# Patient Record
Sex: Female | Born: 1980 | Race: White | Hispanic: No | Smoking: Current every day smoker
Health system: Southern US, Community
[De-identification: ages and names within clinical notes are randomized; demographics above are authoritative.]

---

## 2008-08-24 ENCOUNTER — Ambulatory Visit: Payer: Self-pay | Admitting: Obstetrics and Gynecology

## 2008-08-25 ENCOUNTER — Inpatient Hospital Stay: Payer: Self-pay | Admitting: Obstetrics and Gynecology

## 2008-09-19 ENCOUNTER — Ambulatory Visit: Payer: Self-pay | Admitting: Pediatrics

## 2010-07-26 ENCOUNTER — Ambulatory Visit: Payer: Self-pay | Admitting: Internal Medicine

## 2011-05-30 ENCOUNTER — Ambulatory Visit: Payer: Self-pay

## 2012-08-14 IMAGING — CT CT HEAD WITHOUT CONTRAST
1 of 2 series · 16 of 30 positions shown, 20 images · non-contrast
Comparison: none

REASON FOR EXAM: [id number] altered mental status headaches
COMMENTS:

PROCEDURE:     CT  - CT HEAD WITHOUT CONTRAST  - July 26, 2010 [DATE]
RESULT:     Technique: Helical 5mm sections were obtained from the skull
base to the vertex without administration of intravenous contrast.

[Series 2: soft tissue · axial · 0.39mm/px · z∈[-160,-36]mm · 16 of 29 slices shown, 20 images]
[im 2/29  brain]
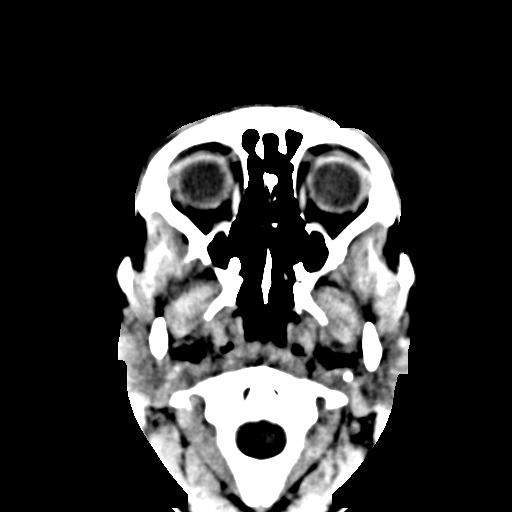
[im 2/29  bone]
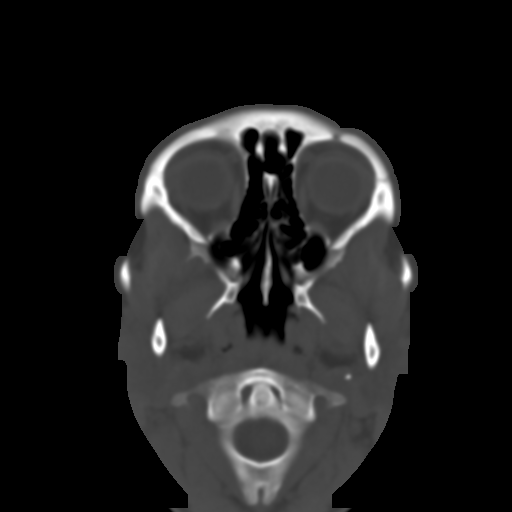
[im 4/29  brain]
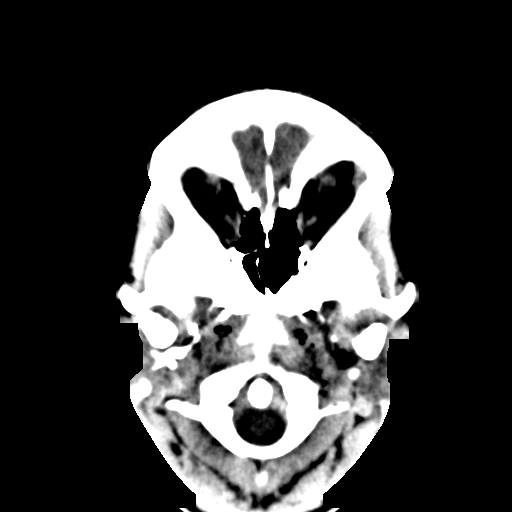
[im 5/29  brain]
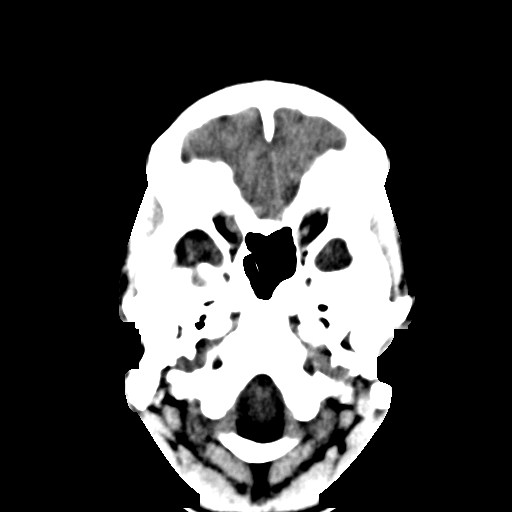
[im 7/29  brain]
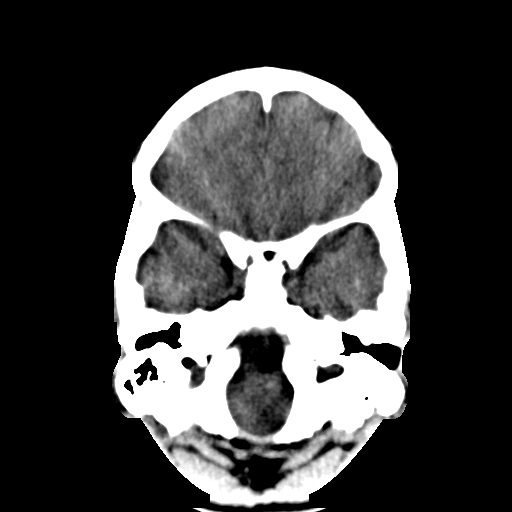
[im 9/29  brain]
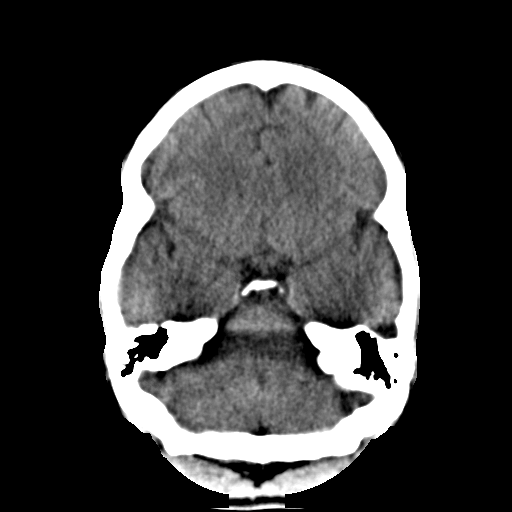
[im 9/29  bone]
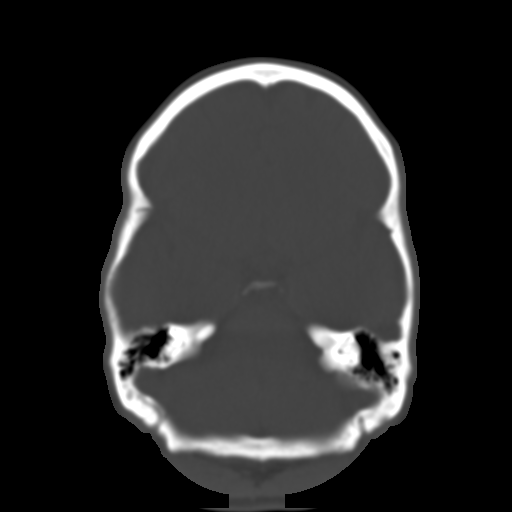
[im 10/29  brain]
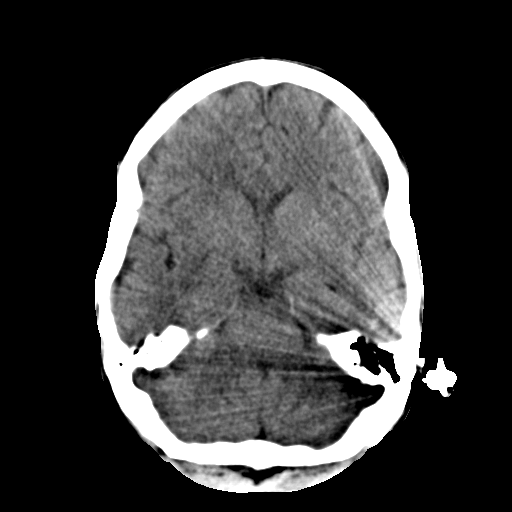
[im 11/29  brain]
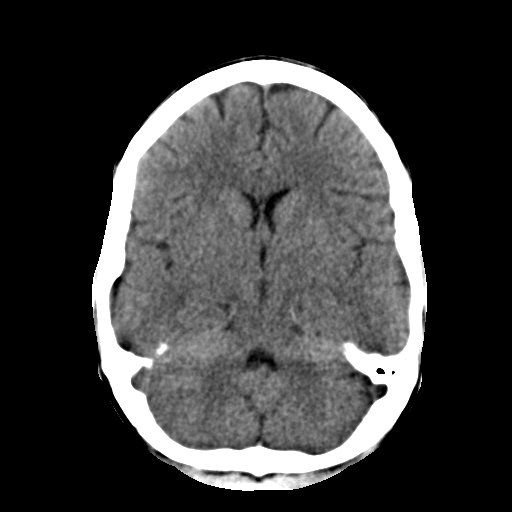
[im 14/29  brain]
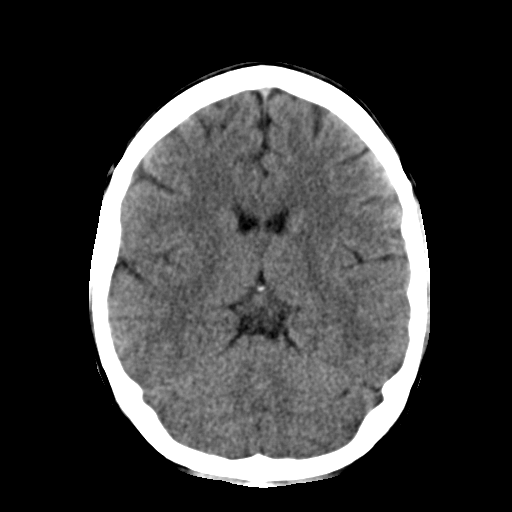
[im 15/29  brain]
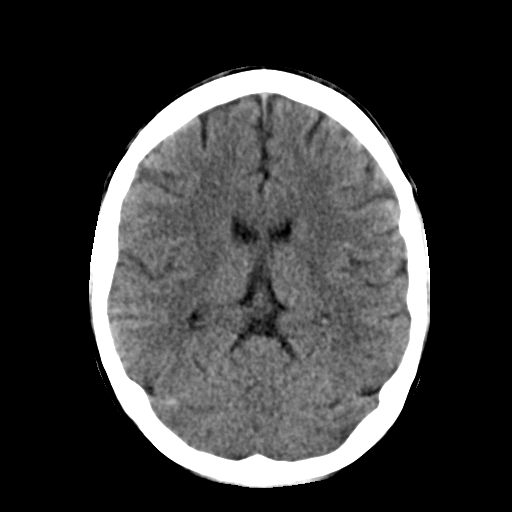
[im 15/29  bone]
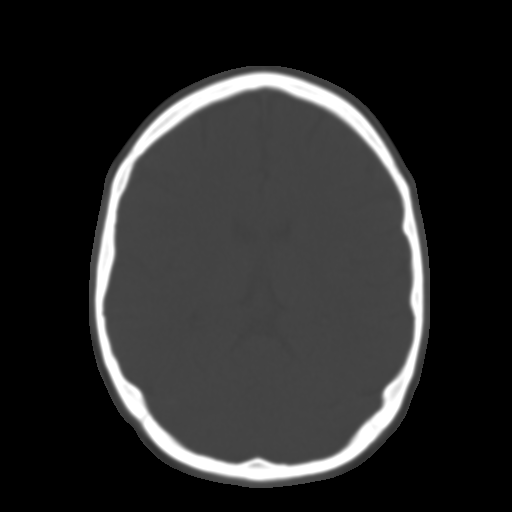
[im 18/29  brain]
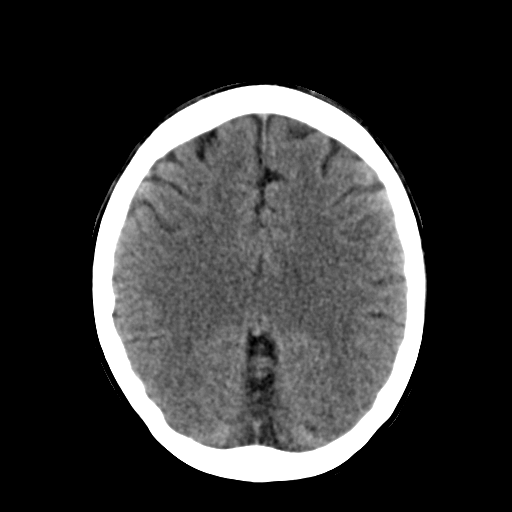
[im 19/29  brain]
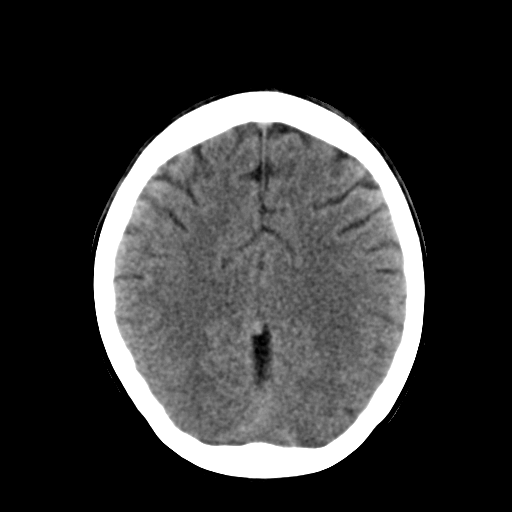
[im 20/29  brain]
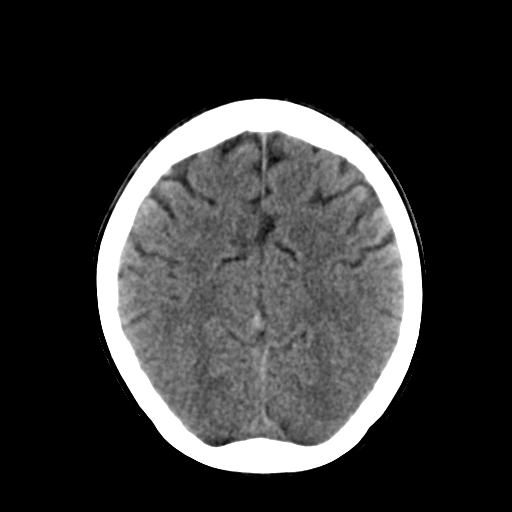
[im 22/29  brain]
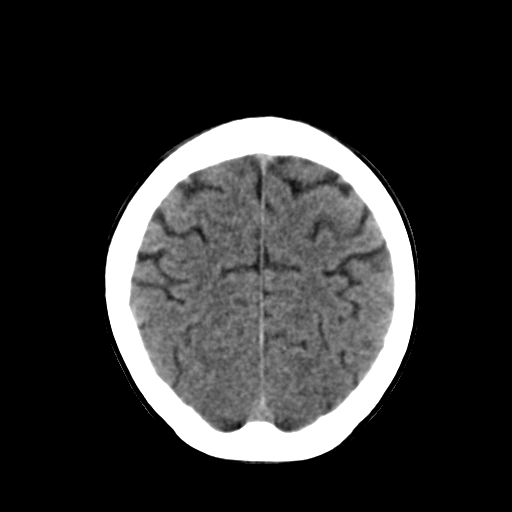
[im 22/29  bone]
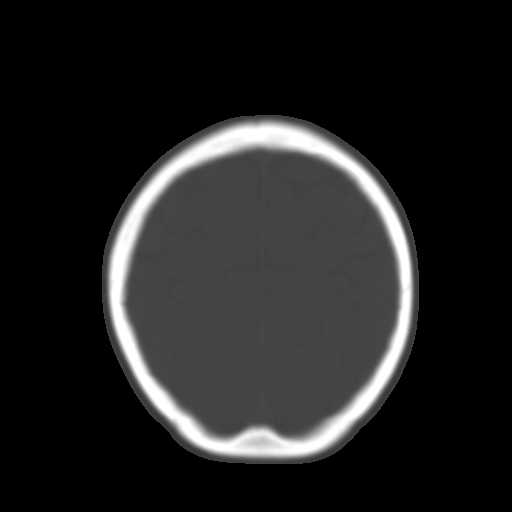
[im 24/29  brain]
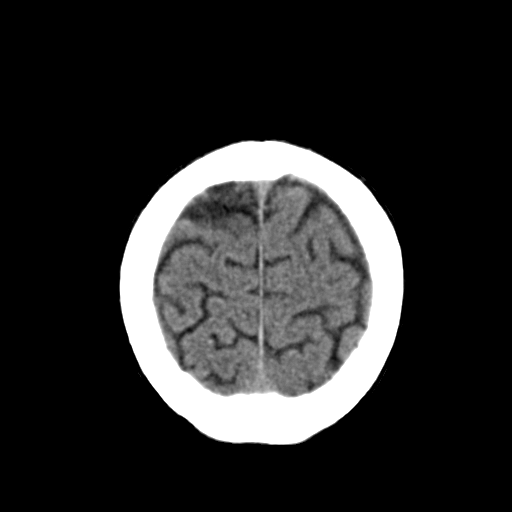
[im 25/29  brain]
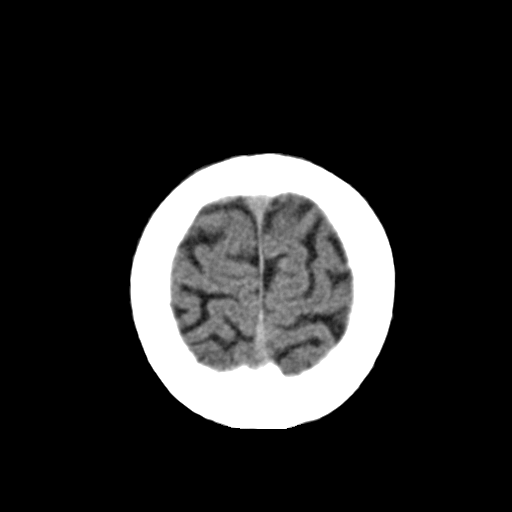
[im 27/29  brain]
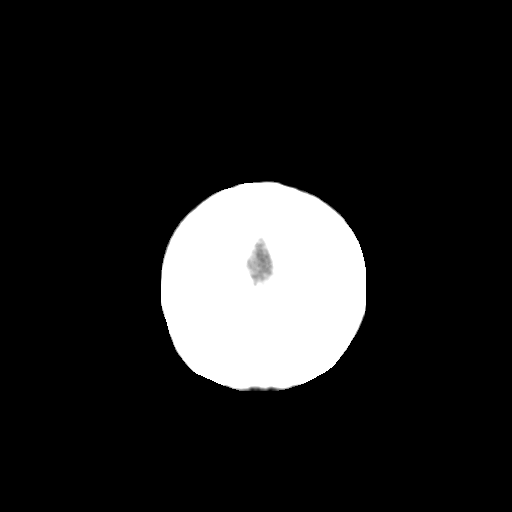

[16 of 30 positions shown; findings below may reference images not displayed]

FINDINGS: There is not evidence of intra-axial fluid collections. There is
no evidence of acute hemorrhage or secondary signs reflecting mass effect or
subacute or chronic focal territorial infarction. The osseous structures
demonstrate no evidence of a depressed skull fracture. If there is
persistent concern clinical follow-up with MRI is recommended.
IMPRESSION: 1. No evidence of acute intracranial abnormalitites.

## 2014-05-02 ENCOUNTER — Encounter: Payer: Self-pay | Admitting: *Deleted

## 2014-05-04 ENCOUNTER — Encounter: Payer: Self-pay | Admitting: *Deleted

## 2020-01-30 ENCOUNTER — Ambulatory Visit
Admission: RE | Admit: 2020-01-30 | Discharge: 2020-01-30 | Disposition: A | Discharge status: Home / Self Care | Payer: 59 | Source: Ambulatory Visit

## 2020-01-30 ENCOUNTER — Other Ambulatory Visit: Payer: Self-pay

## 2020-01-30 VITALS — BP 123/77 | HR 106 | Temp 98.2°F | Resp 14 | Ht 60.0 in | Wt 130.0 lb

## 2020-01-30 DIAGNOSIS — H66001 Acute suppurative otitis media without spontaneous rupture of ear drum, right ear: Secondary | ICD-10-CM

## 2020-01-30 DIAGNOSIS — H9201 Otalgia, right ear: Secondary | ICD-10-CM | POA: Diagnosis not present

## 2020-01-30 MED ORDER — CEFDINIR 300 MG PO CAPS: 300.0000 mg | ORAL_CAPSULE | Freq: Two times a day (BID) | ORAL | 0 refills | Status: AC

## 2020-01-30 NOTE — ED Provider Notes (Signed)
[facility]    CSN: [id number] Arrival date & time: 01/30/20  1109      History   Chief Complaint Chief Complaint  Patient presents with  . Otalgia    APPOINTMENT    HPI Tracey Grant is a 40 y.o. female presenting for right sided ear pain and pressure x 6 days. Denies fever. Admits to fatigue. Denies sore throat or cough. Admits to mild congestion. No ear drainage. Admits muffled/decreased hearing. Has been taking Motrin and Tylenol. Denies COVID exposure, but reports she is a Engineer, civil (consulting).  Patient has no other complaints or concerns.  HPI  History reviewed. No pertinent past medical history.  There are no problems to display for this patient.   History reviewed. No pertinent surgical history.  OB History   No obstetric history on file.      Home Medications    Prior to Admission medications   Medication Sig Start Date End Date Taking? Authorizing Provider  ALPRAZolam Prudy Feeler) 0.5 MG tablet alprazolam 0.5 mg tablet 09/06/16  Yes [provider]  buPROPion (WELLBUTRIN XL) 150 MG 24 hr tablet bupropion HCl XL 150 mg 24 hr tablet, extended release 09/05/16  Yes [provider]  cefdinir (OMNICEF) 300 MG capsule Take 1 capsule (300 mg total) by mouth 2 (two) times daily for 10 days. 01/30/20 02/09/20 Yes Shirlee Latch, PA-C    Family History History reviewed. No pertinent family history.  Social History Social History   Tobacco Use  . Smoking status: Current Every Day Smoker  . Smokeless tobacco: Never Used  Vaping Use  . Vaping Use: Some days  Substance Use Topics  . Alcohol use: Yes  . Drug use: Never     Allergies   Patient has no known allergies.   Review of Systems Review of Systems  Constitutional: Negative for chills, diaphoresis, fatigue and fever.  HENT: Positive for congestion, ear pain and rhinorrhea. Negative for sinus pressure, sinus pain and sore throat.   Respiratory: Negative for cough and shortness of breath.    Gastrointestinal: Negative for abdominal pain, nausea and vomiting.  Musculoskeletal: Negative for arthralgias and myalgias.  Skin: Negative for rash.  Neurological: Negative for weakness and headaches.  Hematological: Negative for adenopathy.     Physical Exam Triage Vital Signs ED Triage Vitals  Enc Vitals Group     BP 01/30/20 1200 123/77     Pulse Rate 01/30/20 1200 (!) 106     Resp 01/30/20 1200 14     Temp 01/30/20 1200 98.2 F (36.8 C)     Temp Source 01/30/20 1200 Oral     SpO2 01/30/20 1200 100 %     Weight 01/30/20 1157 130 lb (59 kg)     Height 01/30/20 1157 5' (1.524 m)     Head Circumference --      Peak Flow --      Pain Score 01/30/20 1157 6     Pain Loc --      Pain Edu? --      Excl. in GC? --    No data found.  Updated Vital Signs BP 123/77 (BP Location: Left Arm)   Pulse (!) 106   Temp 98.2 F (36.8 C) (Oral)   Resp 14   Ht 5' (1.524 m)   Wt 130 lb (59 kg)   LMP 01/23/2020 (Approximate)   SpO2 100%   BMI 25.39 kg/m       Physical Exam Vitals and nursing note reviewed.  Constitutional:      General: She is not in acute distress.    Appearance: Normal appearance. She is not ill-appearing or toxic-appearing.  HENT:     Head: Normocephalic and atraumatic.     Right Ear: Hearing, ear canal and external ear normal. A middle ear effusion is present. Tympanic membrane is injected and bulging.     Left Ear: Hearing, tympanic membrane, ear canal and external ear normal.     Nose: Nose normal.     Mouth/Throat:     Mouth: Mucous membranes are moist.     Pharynx: Oropharynx is clear.  Eyes:     General: No scleral icterus.       Right eye: No discharge.        Left eye: No discharge.     Conjunctiva/sclera: Conjunctivae normal.  Cardiovascular:     Rate and Rhythm: Normal rate and regular rhythm.     Heart sounds: Normal heart sounds.  Pulmonary:     Effort: Pulmonary effort is normal. No respiratory distress.     Breath sounds: Normal  breath sounds.  Musculoskeletal:     Cervical back: Neck supple.  Skin:    General: Skin is dry.  Neurological:     General: No focal deficit present.     Mental Status: She is alert. Mental status is at baseline.     Motor: No weakness.     Gait: Gait normal.  Psychiatric:        Mood and Affect: Mood normal.        Behavior: Behavior normal.        Thought Content: Thought content normal.      UC Treatments / Results  Labs (all labs ordered are listed, but only abnormal results are displayed) Labs Reviewed - No data to display  EKG   Radiology No results found.  Procedures Procedures (including critical care time)  Medications Ordered in UC Medications - No data to display  Initial Impression / Assessment and Plan / UC Course  I have reviewed the triage vital signs and the nursing notes.  Pertinent labs & imaging results that were available during my care of the patient were reviewed by me and considered in my medical decision making (see chart for details).   Exam is consistent with a mild acute otitis media.  Advised watch and wait.  Of 2 days and taking Mucinex D and using Flonase.  Advised if not getting better or symptoms worsen after 3 days to start the cefdinir and complete full course.  Advised increase rest and fluids.  Advised to follow-up with our clinic as needed for new or worsening symptoms.   Final Clinical Impressions(s) / UC Diagnoses   Final diagnoses:  Acute suppurative otitis media of right ear without spontaneous rupture of tympanic membrane, recurrence not specified  Right ear pain     Discharge Instructions     Watch and wait x2 days.  Take Mucinex D and use Flonase.  If not better in 2 days, start antibiotics and complete full course.    ED Prescriptions    Medication Sig Dispense Auth. Provider   cefdinir (OMNICEF) 300 MG capsule Take 1 capsule (300 mg total) by mouth 2 (two) times daily for 10 days. 20 capsule Shirlee Latch,  PA-C     PDMP not reviewed this encounter.   Shirlee Latch, PA-C 01/30/20 1240

## 2020-01-30 NOTE — Discharge Instructions (Addendum)
Watch and wait x2 days.  Take Mucinex D and use Flonase.  If not better in 2 days, start antibiotics and complete full course.

## 2020-01-30 NOTE — ED Triage Notes (Signed)
Patient c/o pain in her right ear that started Monday night.

## 2021-08-28 ENCOUNTER — Ambulatory Visit
Admission: EM | Admit: 2021-08-28 | Discharge: 2021-08-28 | Disposition: A | Discharge status: Home / Self Care | Payer: 59

## 2021-08-28 DIAGNOSIS — J069 Acute upper respiratory infection, unspecified: Secondary | ICD-10-CM | POA: Diagnosis not present

## 2021-08-28 MED ORDER — IPRATROPIUM BROMIDE 0.06 % NA SOLN: 2.0000 | Freq: Four times a day (QID) | NASAL | 12 refills | Status: DC

## 2021-08-28 MED ORDER — PROMETHAZINE-DM 6.25-15 MG/5ML PO SYRP: 5.0000 mL | ORAL_SOLUTION | Freq: Four times a day (QID) | ORAL | 0 refills | Status: DC | PRN

## 2021-08-28 MED ORDER — BENZONATATE 100 MG PO CAPS: 200.0000 mg | ORAL_CAPSULE | Freq: Three times a day (TID) | ORAL | 0 refills | Status: DC

## 2021-08-28 NOTE — ED Triage Notes (Signed)
Patient reports that Friday she started with scratchy throat, cough, and nasal congestion.   Patient has taken 2 at home COVID test both were negative.

## 2021-08-28 NOTE — ED Provider Notes (Signed)
[facility]    CSN: [id number] Arrival date & time: 08/28/21  1740      History   Chief Complaint Chief Complaint  Patient presents with   Cough   Nasal Congestion    HPI Tracey Grant is a 41 y.o. female.   HPI  41 year old female here for evaluation of respiratory complaints.  Patient reports that her symptoms began 4 days ago and they consist of nasal congestion with a scratchy sore throat, nasal discharge in the mornings that gets better as the day goes on.  Right ear pain, and a nonproductive cough.  She denies any fever, shortness of breath, wheezing, vomiting, or diarrhea.  History reviewed. No pertinent past medical history.  There are no problems to display for this patient.   History reviewed. No pertinent surgical history.  OB History   No obstetric history on file.      Home Medications    Prior to Admission medications   Medication Sig Start Date End Date Taking? Authorizing Provider  ALPRAZolam Prudy Feeler) 0.5 MG tablet alprazolam 0.5 mg tablet 09/06/16  Yes [provider]  amphetamine-dextroamphetamine (ADDERALL) 10 MG tablet Take 1 tablet in every  afternoon for 30 days 02/08/21  Yes [provider]  amphetamine-dextroamphetamine (ADDERALL) 20 MG tablet Take 20 mg by mouth 3 (three) times daily. 08/23/21  Yes [provider]  benzonatate (TESSALON) 100 MG capsule Take 2 capsules (200 mg total) by mouth every 8 (eight) hours. 08/28/21  Yes Becky Augusta, NP  buPROPion (WELLBUTRIN XL) 150 MG 24 hr tablet bupropion HCl XL 150 mg 24 hr tablet, extended release 09/05/16  Yes [provider]  ipratropium (ATROVENT) 0.06 % nasal spray Place 2 sprays into both nostrils 4 (four) times daily. 08/28/21  Yes Becky Augusta, NP  promethazine-dextromethorphan (PROMETHAZINE-DM) 6.25-15 MG/5ML syrup Take 5 mLs by mouth 4 (four) times daily as needed. 08/28/21  Yes Becky Augusta, NP    Family History History reviewed. No pertinent  family history.  Social History Social History   Tobacco Use   Smoking status: Every Day   Smokeless tobacco: Never  Vaping Use   Vaping Use: Some days  Substance Use Topics   Alcohol use: Yes   Drug use: Never     Allergies   Patient has no known allergies.   Review of Systems Review of Systems  Constitutional:  Negative for fever.  HENT:  Positive for congestion, ear pain, postnasal drip, rhinorrhea and sore throat.   Respiratory:  Positive for cough. Negative for shortness of breath and wheezing.   Gastrointestinal:  Negative for diarrhea and vomiting.  Hematological: Negative.   Psychiatric/Behavioral: Negative.       Physical Exam Triage Vital Signs ED Triage Vitals  Enc Vitals Group     BP 08/28/21 1748 120/83     Pulse Rate 08/28/21 1748 90     Resp --      Temp 08/28/21 1748 98.4 F (36.9 C)     Temp src --      SpO2 08/28/21 1748 100 %     Weight 08/28/21 1746 134 lb (60.8 kg)     Height 08/28/21 1746 5' (1.524 m)     Head Circumference --      Peak Flow --      Pain Score 08/28/21 1746 0     Pain Loc --      Pain Edu? --      Excl. in GC? --    No  data found.  Updated Vital Signs BP 120/83 (BP Location: Left Arm)   Pulse 90   Temp 98.4 F (36.9 C)   Ht 5' (1.524 m)   Wt 134 lb (60.8 kg)   LMP 08/08/2021 (Approximate)   SpO2 100%   BMI 26.17 kg/m   Visual Acuity Right Eye Distance:   Left Eye Distance:   Bilateral Distance:    Right Eye Near:   Left Eye Near:    Bilateral Near:     Physical Exam Vitals and nursing note reviewed.  Constitutional:      Appearance: Normal appearance. She is not ill-appearing.  HENT:     Head: Normocephalic and atraumatic.     Right Ear: Tympanic membrane, ear canal and external ear normal. There is no impacted cerumen.     Left Ear: Tympanic membrane, ear canal and external ear normal. There is no impacted cerumen.     Nose: Congestion and rhinorrhea present.     Mouth/Throat:     Mouth: Mucous  membranes are moist.     Pharynx: Oropharynx is clear. Posterior oropharyngeal erythema present. No oropharyngeal exudate.  Cardiovascular:     Rate and Rhythm: Normal rate and regular rhythm.     Pulses: Normal pulses.     Heart sounds: Normal heart sounds. No murmur heard.    No friction rub. No gallop.  Pulmonary:     Effort: Pulmonary effort is normal.     Breath sounds: Normal breath sounds. No wheezing, rhonchi or rales.  Musculoskeletal:     Cervical back: Normal range of motion and neck supple.  Lymphadenopathy:     Cervical: No cervical adenopathy.  Skin:    General: Skin is warm and dry.     Capillary Refill: Capillary refill takes less than 2 seconds.     Findings: No erythema or rash.  Neurological:     General: No focal deficit present.     Mental Status: She is alert and oriented to person, place, and time.  Psychiatric:        Mood and Affect: Mood normal.        Behavior: Behavior normal.        Thought Content: Thought content normal.        Judgment: Judgment normal.      UC Treatments / Results  Labs (all labs ordered are listed, but only abnormal results are displayed) Labs Reviewed - No data to display  EKG   Radiology No results found.  Procedures Procedures (including critical care time)  Medications Ordered in UC Medications - No data to display  Initial Impression / Assessment and Plan / UC Course  I have reviewed the triage vital signs and the nursing notes.  Pertinent labs & imaging results that were available during my care of the patient were reviewed by me and considered in my medical decision making (see chart for details).  Patient is a very pleasant, nontoxic-appearing 41 year old female here for evaluation of respiratory complaints outlined HPI above.  Physical exam reveals pearly-gray tympanic membranes bilaterally with normal light reflex and clear external auditory canals.  Nasal mucosa is edematous and mildly erythematous with  scant clear discharge in both nares.  Oropharyngeal exam reveals mild posterior oropharyngeal erythema with clear postnasal drip.  No injection noted.  No tonsillar hypertrophy or exudate appreciated on exam.  No cervical adenopathy appreciable exam.  Cardiopulmonary exam reveals S1-S2 heart sounds with regular rate and rhythm and lung sounds are clear auscultation all fields.  Patient's exam is consistent with a viral URI with cough.  I do not feel she needs an antibiotic at this time though if her symptoms worsen or continue beyond 10 days this could be revisited.  I will treat her with Atrovent nasal spray, Tessalon Perles, and Promethazine DM cough syrup.   Final Clinical Impressions(s) / UC Diagnoses   Final diagnoses:  Viral URI with cough     Discharge Instructions      Use the Atrovent nasal spray, 2 squirts in each nostril every 6 hours, as needed for runny nose and postnasal drip.  Use the Tessalon Perles every 8 hours during the day.  Take them with a small sip of water.  They may give you some numbness to the base of your tongue or a metallic taste in your mouth, this is normal.  Use the Promethazine DM cough syrup at bedtime for cough and congestion.  It will make you drowsy so do not take it during the day.  Return for reevaluation or see your primary care provider for any new or worsening symptoms.      ED Prescriptions     Medication Sig Dispense Auth. Provider   benzonatate (TESSALON) 100 MG capsule Take 2 capsules (200 mg total) by mouth every 8 (eight) hours. 21 capsule Becky Augusta, NP   ipratropium (ATROVENT) 0.06 % nasal spray Place 2 sprays into both nostrils 4 (four) times daily. 15 mL Becky Augusta, NP   promethazine-dextromethorphan (PROMETHAZINE-DM) 6.25-15 MG/5ML syrup Take 5 mLs by mouth 4 (four) times daily as needed. 118 mL Becky Augusta, NP      PDMP not reviewed this encounter.   Becky Augusta, NP 08/28/21 1801

## 2021-08-28 NOTE — Discharge Instructions (Signed)

## 2022-05-20 ENCOUNTER — Ambulatory Visit
Admission: EM | Admit: 2022-05-20 | Discharge: 2022-05-20 | Disposition: A | Discharge status: Home / Self Care | Payer: 59 | Attending: Physician Assistant | Admitting: Physician Assistant

## 2022-05-20 ENCOUNTER — Inpatient Hospital Stay
Admission: RE | Admit: 2022-05-20 | Discharge: 2022-05-20 | Disposition: A | Discharge status: Home / Self Care | Payer: 59 | Source: Ambulatory Visit

## 2022-05-20 DIAGNOSIS — J069 Acute upper respiratory infection, unspecified: Secondary | ICD-10-CM | POA: Insufficient documentation

## 2022-05-20 LAB — GROUP A STREP BY PCR: Group A Strep by PCR: NOT DETECTED

## 2022-05-20 MED ORDER — IPRATROPIUM BROMIDE 0.06 % NA SOLN: 2.0000 | Freq: Four times a day (QID) | NASAL | 12 refills | Status: AC

## 2022-05-20 MED ORDER — PROMETHAZINE-DM 6.25-15 MG/5ML PO SYRP: 5.0000 mL | ORAL_SOLUTION | Freq: Four times a day (QID) | ORAL | 0 refills | Status: AC | PRN

## 2022-05-20 MED ORDER — BENZONATATE 100 MG PO CAPS: 200.0000 mg | ORAL_CAPSULE | Freq: Three times a day (TID) | ORAL | 0 refills | Status: AC

## 2022-05-20 NOTE — ED Triage Notes (Addendum)
Pt c/o sore throat x3 days. Denies any fevers. Has tried tylenol & ibuprofen w/o relief.

## 2022-05-20 NOTE — ED Provider Notes (Signed)
[facility]    CSN: [id number] Arrival date & time: 05/20/22  4098      History   Chief Complaint Chief Complaint  Patient presents with   Sore Throat    HPI Tracey Grant is a 41 y.o. female.   HPI  42 year old female with a past medical history significant for ADHD, anxiety, depression, tension headache, and IDA presents for evaluation of sore throat x 3 days with no associated fever.  She reports that she has been using Tylenol and ibuprofen at home without any relief of symptoms.  History reviewed. No pertinent past medical history.  There are no problems to display for this patient.   History reviewed. No pertinent surgical history.  OB History   No obstetric history on file.      Home Medications    Prior to Admission medications   Medication Sig Start Date End Date Taking? Authorizing Provider  ALPRAZolam Prudy Feeler) 0.5 MG tablet alprazolam 0.5 mg tablet 09/06/16  Yes [provider]  amphetamine-dextroamphetamine (ADDERALL) 10 MG tablet Take 1 tablet in every  afternoon for 30 days 02/08/21  Yes [provider]  amphetamine-dextroamphetamine (ADDERALL) 20 MG tablet Take 20 mg by mouth 3 (three) times daily. 08/23/21  Yes [provider]  benzonatate (TESSALON) 100 MG capsule Take 2 capsules (200 mg total) by mouth every 8 (eight) hours. 05/20/22  Yes Becky Augusta, NP  buPROPion (WELLBUTRIN XL) 150 MG 24 hr tablet bupropion HCl XL 150 mg 24 hr tablet, extended release 09/05/16  Yes [provider]  ipratropium (ATROVENT) 0.06 % nasal spray Place 2 sprays into both nostrils 4 (four) times daily. 05/20/22  Yes Becky Augusta, NP  promethazine-dextromethorphan (PROMETHAZINE-DM) 6.25-15 MG/5ML syrup Take 5 mLs by mouth 4 (four) times daily as needed. 05/20/22  Yes Becky Augusta, NP    Family History History reviewed. No pertinent family history.  Social History Social History   Tobacco Use   Smoking status: Every Day    Smokeless tobacco: Never  Vaping Use   Vaping Use: Some days  Substance Use Topics   Alcohol use: Yes   Drug use: Never     Allergies   Amitriptyline and Topiramate   Review of Systems Review of Systems  Constitutional:  Negative for fever.  HENT:  Positive for congestion, rhinorrhea and sore throat. Negative for ear pain.   Respiratory:  Positive for cough. Negative for shortness of breath and wheezing.   Gastrointestinal:  Negative for diarrhea, nausea and vomiting.  Musculoskeletal:  Positive for arthralgias and myalgias.     Physical Exam Triage Vital Signs ED Triage Vitals  Enc Vitals Group     BP 05/20/22 1138 105/69     Pulse Rate 05/20/22 1138 92     Resp 05/20/22 1138 16     Temp 05/20/22 1138 98.4 F (36.9 C)     Temp Source 05/20/22 1138 Oral     SpO2 05/20/22 1138 98 %     Weight 05/20/22 1137 126 lb (57.2 kg)     Height 05/20/22 1137 4\' 8"  (1.422 m)     Head Circumference --      Peak Flow --      Pain Score 05/20/22 1142 6     Pain Loc --      Pain Edu? --      Excl. in GC? --    No data found.  Updated Vital Signs BP 105/69 (BP Location: Left Arm)   Pulse 92  Temp 98.4 F (36.9 C) (Oral)   Resp 16   Ht 4\' 8"  (1.422 m)   Wt 126 lb (57.2 kg)   SpO2 98%   BMI 28.25 kg/m   Visual Acuity Right Eye Distance:   Left Eye Distance:   Bilateral Distance:    Right Eye Near:   Left Eye Near:    Bilateral Near:     Physical Exam Vitals and nursing note reviewed.  Constitutional:      Appearance: Normal appearance. She is not ill-appearing.  HENT:     Head: Normocephalic and atraumatic.     Right Ear: Tympanic membrane, ear canal and external ear normal. There is no impacted cerumen.     Left Ear: Tympanic membrane, ear canal and external ear normal. There is no impacted cerumen.     Nose: Congestion and rhinorrhea present.     Comments: Nasal mucosa is erythematous and edematous with clear discharge in both nares.    Mouth/Throat:      Mouth: Mucous membranes are moist.     Pharynx: Oropharynx is clear. Posterior oropharyngeal erythema present. No oropharyngeal exudate.     Comments: Tonsillar pillars are unremarkable.  There is mild erythema and injection to the posterior oropharynx with clear postnasal drip. Cardiovascular:     Rate and Rhythm: Normal rate and regular rhythm.     Pulses: Normal pulses.     Heart sounds: Normal heart sounds. No murmur heard.    No friction rub. No gallop.  Pulmonary:     Effort: Pulmonary effort is normal.     Breath sounds: Normal breath sounds. No wheezing, rhonchi or rales.  Musculoskeletal:     Cervical back: Normal range of motion and neck supple.  Lymphadenopathy:     Cervical: No cervical adenopathy.  Skin:    General: Skin is warm and dry.     Capillary Refill: Capillary refill takes less than 2 seconds.     Findings: No erythema or rash.  Neurological:     General: No focal deficit present.     Mental Status: She is alert and oriented to person, place, and time.      UC Treatments / Results  Labs (all labs ordered are listed, but only abnormal results are displayed) Labs Reviewed  GROUP A STREP BY PCR    EKG   Radiology No results found.  Procedures Procedures (including critical care time)  Medications Ordered in UC Medications - No data to display  Initial Impression / Assessment and Plan / UC Course  I have reviewed the triage vital signs and the nursing notes.  Pertinent labs & imaging results that were available during my care of the patient were reviewed by me and considered in my medical decision making (see chart for details).   Patient is a pleasant, nontoxic-appearing 42 year old female presenting for evaluation of 3 days worth of sore throat and URI symptoms as outlined in HPI above.  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  On exam she does have inflamed nasal mucosa with clear nasal discharge as well as  erythema injection to the posterior oropharynx with clear postnasal drip.  Tonsillar pillars do not demonstrate any erythema, edema, or exudate.  There is also no cervical lymphadenopathy on exam and her cardiopulmonary exam is benign.  Strep PCR was collected at triage and is negative.  Her exam is consistent with a viral upper respiratory infection.  We discussed COVID testing  and she is declining at this time.  I have advised her that she needs to mask when she is around others due to her respiratory symptoms.  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  I will treat her symptoms with intranasal spray, Tessalon Perles, and Promethazine DM cough syrup.  Return precautions reviewed.   Final Clinical Impressions(s) / UC Diagnoses   Final diagnoses:  Viral URI with cough     Discharge Instructions      Use the Atrovent nasal spray, 2 squirts in each nostril every 6 hours, as needed for runny nose and postnasal drip.  Use the Tessalon Perles every 8 hours during the day.  Take them with a small sip of water.  They may give you some numbness to the base of your tongue or a metallic taste in your mouth, this is normal.  Use the Promethazine DM cough syrup at bedtime for cough and congestion.  It will make you drowsy so do not take it during the day.  Return for reevaluation or see your primary care provider for any new or worsening symptoms.      ED Prescriptions     Medication Sig Dispense Auth. Provider   benzonatate (TESSALON) 100 MG capsule Take 2 capsules (200 mg total) by mouth every 8 (eight) hours. 21 capsule Becky Augusta, NP   ipratropium (ATROVENT) 0.06 % nasal spray Place 2 sprays into both nostrils 4 (four) times daily. 15 mL Becky Augusta, NP   promethazine-dextromethorphan (PROMETHAZINE-DM) 6.25-15 MG/5ML syrup Take 5 mLs by mouth 4 (four) times daily as needed. 118 mL Becky Augusta, NP      PDMP not reviewed this encounter.   Becky Augusta,  NP 05/20/22 1235

## 2022-05-20 NOTE — Discharge Instructions (Signed)

## 2022-05-22 ENCOUNTER — Ambulatory Visit: Payer: Self-pay

## 2022-08-27 ENCOUNTER — Ambulatory Visit: Payer: Self-pay

## 2023-06-02 ENCOUNTER — Other Ambulatory Visit: Payer: Self-pay | Admitting: Neurology

## 2023-06-02 DIAGNOSIS — R519 Headache, unspecified: Secondary | ICD-10-CM

## 2023-06-07 ENCOUNTER — Ambulatory Visit
Admission: RE | Admit: 2023-06-07 | Discharge: 2023-06-07 | Disposition: A | Discharge status: Home / Self Care | Source: Ambulatory Visit | Attending: Neurology | Admitting: Neurology

## 2023-06-07 DIAGNOSIS — R519 Headache, unspecified: Secondary | ICD-10-CM | POA: Diagnosis present

## 2023-06-07 MED ORDER — GADOBUTROL 1 MMOL/ML IV SOLN
5.0000 mL | Freq: Once | INTRAVENOUS | Status: AC | PRN
  Administered 2023-06-07: 5 mL via INTRAVENOUS

## 2023-11-05 ENCOUNTER — Telehealth

## 2023-11-06 ENCOUNTER — Telehealth
